# Patient Record
Sex: Male | Born: 1995 | Race: White | Hispanic: No | Marital: Single
Health system: Southern US, Community
[De-identification: ages and names within clinical notes are randomized; demographics above are authoritative.]

---
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
## 2017-03-04 ENCOUNTER — Telehealth: Payer: Self-pay | Admitting: Pediatrics

## 2017-03-04 NOTE — Telephone Encounter (Signed)
Patient is having severe allergies this year Was last seen by [facility] - per pt Can patient have a medication called in?? Does pt need to be seen for an injection??  Please call patients mother at [phone] to answer any questions  Pharmacy:: [facility] on [address]

## 2017-03-04 NOTE — Telephone Encounter (Signed)
Spoke to mother and informed her that he had to schedule a new patient appt. She understood and scheduled

## 2019-10-15 ENCOUNTER — Other Ambulatory Visit: Payer: Self-pay

## 2019-10-15 DIAGNOSIS — Z20822 Contact with and (suspected) exposure to covid-19: Secondary | ICD-10-CM

## 2019-10-16 LAB — NOVEL CORONAVIRUS, NAA: SARS-CoV-2, NAA: NOT DETECTED

## 2020-11-19 DIAGNOSIS — R1013 Epigastric pain: Secondary | ICD-10-CM | POA: Diagnosis not present

## 2020-11-19 DIAGNOSIS — R112 Nausea with vomiting, unspecified: Secondary | ICD-10-CM | POA: Diagnosis not present

## 2022-07-27 DIAGNOSIS — J3089 Other allergic rhinitis: Secondary | ICD-10-CM | POA: Insufficient documentation

## 2023-05-31 ENCOUNTER — Ambulatory Visit (INDEPENDENT_AMBULATORY_CARE_PROVIDER_SITE_OTHER): Payer: Medicare Other | Admitting: Podiatry

## 2023-05-31 ENCOUNTER — Encounter: Payer: Self-pay | Admitting: Podiatry

## 2023-05-31 VITALS — BP 130/70

## 2023-05-31 DIAGNOSIS — Q667 Congenital pes cavus, unspecified foot: Secondary | ICD-10-CM | POA: Diagnosis not present

## 2023-05-31 DIAGNOSIS — D2372 Other benign neoplasm of skin of left lower limb, including hip: Secondary | ICD-10-CM | POA: Diagnosis not present

## 2023-05-31 NOTE — Progress Notes (Signed)
  Subjective:  Patient ID: Joseph Meyer, male    DOB: [birth year],   MRN: [id number]  Chief Complaint  Patient presents with   Callouses    Left 5th toe which sometimes bother him when standing on foot all day    27 y.o. male presents for concern of left fifth toe pain and possible lesion. Here today with dad who relates he has autism and relates complains of pain on and off. He does work on his feet and the toe area can get painful  . Denies any other pedal complaints. Denies n/v/f/c.   History reviewed. No pertinent past medical history.  Objective:  Physical Exam: Vascular: DP/PT pulses 2/4 bilateral. CFT <3 seconds. Normal hair growth on digits. No edema.  Skin. No lacerations or abrasions bilateral feet. Hyperkeratotic cored lesion noted sub fifth metatarsal head and second metatarsal head as well as plantar fifth digit.  Musculoskeletal: MMT 5/5 bilateral lower extremities in DF, PF, Inversion and Eversion. Deceased ROM in DF of ankle joint. Cavus type foot bilatearl with plantar flexed first metatrsal.  Neurological: Sensation intact to light touch.   Assessment:   1. Benign neoplasm of skin of left foot   2. Pes cavus      Plan:  Patient was evaluated and treated and all questions answered. -Discussed  benign lesions of the foot  with patient and treatment options.  -Hyperkeratotic tissue was debrided with chisel without incident.  -Applied salycylic acid treatment to area with dressing. Advised to remove bandaging tomorrow.  -Encouraged daily moisturizing -Discussed use of pumice stone -Advised good supportive shoes and inserts -Discussed treatement options; discussed pes cavus  deformity;conservative and  surgical  -Discussed CMO.  -Recommend good supportive shoes -Patient to return to office as needed or sooner if condition worsens.   Louann Sjogren, DPM

## 2024-02-06 ENCOUNTER — Emergency Department (HOSPITAL_BASED_OUTPATIENT_CLINIC_OR_DEPARTMENT_OTHER): Payer: Worker's Compensation | Admitting: Radiology

## 2024-02-06 ENCOUNTER — Other Ambulatory Visit: Payer: Self-pay

## 2024-02-06 ENCOUNTER — Emergency Department (HOSPITAL_BASED_OUTPATIENT_CLINIC_OR_DEPARTMENT_OTHER)
Admission: EM | Admit: 2024-02-06 | Discharge: 2024-02-07 | Disposition: A | Discharge status: Home / Self Care | Payer: Worker's Compensation | Attending: Emergency Medicine | Admitting: Emergency Medicine

## 2024-02-06 ENCOUNTER — Encounter (HOSPITAL_BASED_OUTPATIENT_CLINIC_OR_DEPARTMENT_OTHER): Payer: Self-pay | Admitting: Emergency Medicine

## 2024-02-06 ENCOUNTER — Emergency Department (HOSPITAL_BASED_OUTPATIENT_CLINIC_OR_DEPARTMENT_OTHER)

## 2024-02-06 DIAGNOSIS — Y990 Civilian activity done for income or pay: Secondary | ICD-10-CM | POA: Insufficient documentation

## 2024-02-06 DIAGNOSIS — S79912A Unspecified injury of left hip, initial encounter: Secondary | ICD-10-CM | POA: Diagnosis present

## 2024-02-06 DIAGNOSIS — F840 Autistic disorder: Secondary | ICD-10-CM | POA: Diagnosis not present

## 2024-02-06 DIAGNOSIS — W010XXA Fall on same level from slipping, tripping and stumbling without subsequent striking against object, initial encounter: Secondary | ICD-10-CM | POA: Diagnosis not present

## 2024-02-06 DIAGNOSIS — S32425A Nondisplaced fracture of posterior wall of left acetabulum, initial encounter for closed fracture: Secondary | ICD-10-CM | POA: Insufficient documentation

## 2024-02-06 LAB — CBC WITH DIFFERENTIAL/PLATELET
Abs Immature Granulocytes: 0.1 10*3/uL — ABNORMAL HIGH (ref 0.00–0.07)
Basophils Absolute: 0.1 10*3/uL (ref 0.0–0.1)
Basophils Relative: 0 %
Eosinophils Absolute: 0 10*3/uL (ref 0.0–0.5)
Eosinophils Relative: 0 %
HCT: 43.5 % (ref 39.0–52.0)
Hemoglobin: 14.5 g/dL (ref 13.0–17.0)
Immature Granulocytes: 1 %
Lymphocytes Relative: 7 %
Lymphs Abs: 1 10*3/uL (ref 0.7–4.0)
MCH: 28.4 pg (ref 26.0–34.0)
MCHC: 33.3 g/dL (ref 30.0–36.0)
MCV: 85.1 fL (ref 80.0–100.0)
Monocytes Absolute: 0.8 10*3/uL (ref 0.1–1.0)
Monocytes Relative: 6 %
Neutro Abs: 12.8 10*3/uL — ABNORMAL HIGH (ref 1.7–7.7)
Neutrophils Relative %: 86 %
Platelets: 197 10*3/uL (ref 150–400)
RBC: 5.11 MIL/uL (ref 4.22–5.81)
RDW: 13.5 % (ref 11.5–15.5)
WBC: 14.8 10*3/uL — ABNORMAL HIGH (ref 4.0–10.5)
nRBC: 0 % (ref 0.0–0.2)

## 2024-02-06 LAB — BASIC METABOLIC PANEL
Anion gap: 8 (ref 5–15)
BUN: 17 mg/dL (ref 6–20)
CO2: 27 mmol/L (ref 22–32)
Calcium: 9 mg/dL (ref 8.9–10.3)
Chloride: 105 mmol/L (ref 98–111)
Creatinine, Ser: 1.23 mg/dL (ref 0.61–1.24)
GFR, Estimated: >60 mL/min (ref >60)
Glucose, Bld: 99 mg/dL (ref 70–99)
Potassium: 3.7 mmol/L (ref 3.5–5.1)
Sodium: 140 mmol/L (ref 135–145)

## 2024-02-06 MED ORDER — FENTANYL CITRATE PF 50 MCG/ML IJ SOSY
50.0000 ug | PREFILLED_SYRINGE | Freq: Once | INTRAMUSCULAR | Status: AC
  Administered 2024-02-06: 50 ug via INTRAVENOUS
  Filled 2024-02-06: qty 1

## 2024-02-06 NOTE — ED Provider Notes (Signed)
 [facility]ENC[facility] DEPARTMENT AT [facility] Provider Note   CSN: [id number] Arrival date & time: 02/06/24  2124     History {Add pertinent medical, surgical, social history, OB history to HPI:1} Chief Complaint  Patient presents with   Joseph Meyer is a 28 y.o. male.  Patient to ED after fall earlier today. He was mopping the floor and slipped, landing on his left hip. No other injury. History of autism.   The history is provided by the patient and a parent. No language interpreter was used.  Fall       Home Medications Prior to Admission medications   Medication Sig Start Date End Date Taking? Authorizing Provider  fluticasone (FLONASE) 50 MCG/ACT nasal spray Place 2 sprays into both nostrils daily. 03/15/23   [provider]  levocetirizine (XYZAL) 5 MG tablet Take by mouth. 03/15/23   [provider]  naproxen (NAPROSYN) 500 MG tablet TAKE 1 TABLET(500 MG) BY MOUTH TWICE DAILY WITH MEALS FOR 10 DAYS 03/16/23   [provider]  UNABLE TO FIND Take by mouth.    [provider]      Allergies    Blue dyes (parenteral) and Other    Review of Systems   Review of Systems  Physical Exam Updated Vital Signs BP 125/84   Pulse 80   Temp 98.2 F (36.8 C) (Oral)   Resp 18   Wt 124.7 kg   SpO2 97%  Physical Exam Vitals and nursing note reviewed.     ED Results / Procedures / Treatments   Labs (all labs ordered are listed, but only abnormal results are displayed) Labs Reviewed - No data to display  EKG None  Radiology DG Hip Unilat W or Wo Pelvis 2-3 Views Left Result Date: 02/06/2024 CLINICAL DATA:  Recent fall with left femoral pain, initial encounter EXAM: DG HIP (WITH OR WITHOUT PELVIS) 2V LEFT COMPARISON:  None Available. FINDINGS: There are fractures noted within the left acetabulum posteriorly. The left proximal femur is well seated. The remainder of the pelvic ring is intact. IMPRESSION: Posterior left  acetabular fracture without significant displacement. Electronically Signed   By: Alcide Clever M.D.   On: 02/06/2024 22:10    Procedures Procedures  {Document cardiac monitor, telemetry assessment procedure when appropriate:1}  Medications Ordered in ED Medications - No data to display  ED Course/ Medical Decision Making/ A&P   {   Click here for ABCD2, HEART and other calculatorsREFRESH Note before signing :1}                              Medical Decision Making This patient presents to the ED for concern of hip pain, this involves an extensive number of treatment options, and is a complaint that carries with it a high risk of complications and morbidity.  The differential diagnosis includes fracture, dislocation, soft tissue injury   Co morbidities that complicate the patient evaluation  Autism   Additional history obtained:  Additional history and/or information obtained from chart review, notable for n/a   Lab Tests:  I Ordered, and personally interpreted labs.  The pertinent results include:  ***    Imaging Studies ordered:  I ordered imaging studies including left hip plain film I independently visualized and interpreted imaging which showed acetabular fracture visualized. I agree with radiology interpretation   Cardiac Monitoring:  The patient was maintained on a cardiac monitor.  I personally viewed and interpreted the cardiac monitored which showed an underlying rhythm of: n/a   Medicines ordered and prescription drug management:  I ordered medication including ***  for *** Reevaluation of the patient after these medicines showed that the patient {resolved/improved/worsened:23923::"improved"} I have reviewed the patients home medicines and have made adjustments as needed   Test Considered:  CT hip for fracture definition   Critical Interventions:  N/a   Consultations Obtained:  I requested consultation with the orthopedics, Dr. Christell Constant,  and  discussed lab and imaging findings as well as pertinent plan - they recommend: ***   Problem List / ED Course:  Fall onto left hip - continues to have pain Hip xray showing acetabular fx - CT ordered on recommendation of orthopedics Christell Constant) Basic labs obtained, IV started for pain control   Reevaluation:  After the interventions noted above, I reevaluated the patient and found that they have :{resolved/improved/worsened:23923::"improved"}   Social Determinants of Health:  ***   Disposition:  After consideration of the diagnostic results and the patients response to treatment, I feel that the patient would benefit from ***.   Amount and/or Complexity of Data Reviewed Labs: ordered. Radiology: ordered.  Risk Prescription drug management.   ***  {Document critical care time when appropriate:1} {Document review of labs and clinical decision tools ie heart score, Chads2Vasc2 etc:1}  {Document your independent review of radiology images, and any outside records:1} {Document your discussion with family members, caretakers, and with consultants:1} {Document social determinants of health affecting pt's care:1} {Document your decision making why or why not admission, treatments were needed:1} Final Clinical Impression(s) / ED Diagnoses Final diagnoses:  None    Rx / DC Orders ED Discharge Orders     None

## 2024-02-06 NOTE — ED Triage Notes (Signed)
 Patient BIB [facility] c/o mechanical fall from standing position landing on his left hip.  Patient c/o left hip pain.

## 2024-02-07 MED ORDER — IBUPROFEN 400 MG PO TABS
600.0000 mg | ORAL_TABLET | Freq: Once | ORAL | Status: AC
  Administered 2024-02-07: 600 mg via ORAL
  Filled 2024-02-07: qty 1

## 2024-02-07 MED ORDER — METHOCARBAMOL 500 MG PO TABS: 500.0000 mg | ORAL_TABLET | Freq: Four times a day (QID) | ORAL | 0 refills | Status: DC | PRN

## 2024-02-07 MED ORDER — OXYCODONE-ACETAMINOPHEN 7.5-325 MG PO TABS: 1.0000 | ORAL_TABLET | ORAL | 0 refills | Status: AC | PRN

## 2024-02-07 MED ORDER — IBUPROFEN 600 MG PO TABS: 600.0000 mg | ORAL_TABLET | Freq: Four times a day (QID) | ORAL | 0 refills | Status: AC | PRN

## 2024-02-07 MED ORDER — OXYCODONE-ACETAMINOPHEN 5-325 MG PO TABS
1.0000 | ORAL_TABLET | Freq: Once | ORAL | Status: AC
  Administered 2024-02-07: 1 via ORAL
  Filled 2024-02-07: qty 1

## 2024-02-07 NOTE — Discharge Instructions (Signed)
 Use the crutches to be "touch down" only. This means you can rest your foot on the floor but not use the foot/leg to carry any of your weight.   Take Endocet and ibuprofen for pain as directed. Follow up with Dr. Christell Constant by calling the office in the morning and letting them know Dr. Christell Constant wants to see you in about 10 days for an acetabular fracture.

## 2024-02-22 ENCOUNTER — Ambulatory Visit (INDEPENDENT_AMBULATORY_CARE_PROVIDER_SITE_OTHER): Payer: Worker's Compensation | Admitting: Orthopedic Surgery

## 2024-02-22 ENCOUNTER — Other Ambulatory Visit (INDEPENDENT_AMBULATORY_CARE_PROVIDER_SITE_OTHER): Payer: Worker's Compensation

## 2024-02-22 VITALS — Ht 74.0 in | Wt 275.0 lb

## 2024-02-22 DIAGNOSIS — R102 Pelvic and perineal pain: Secondary | ICD-10-CM | POA: Diagnosis not present

## 2024-02-22 NOTE — Progress Notes (Signed)
 Orthopedic Surgery Progress Note   Assessment: Patient is a 28 y.o. male with nondisplaced left posterior wall acetabulum fracture -Date of injury: 02/06/2024 (~2 weeks from injury)   Plan: -Will continue with nonoperative management -DVT ppx: aspirin 81mg  BID - Would recommend touchdown weightbearing with patient has already wean himself to a cane and is weightbearing with some offloading on the right side.  Encouraged him to continue to offload the left side -Provided him with work restrictions.  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  He landed on his left side.  He presented to the [facility] department and was found to have a left acetabular fracture.  Orthopedics was called and recommended touchdown weightbearing.  He initially had a lot of pain but that has gotten significantly better with time.  He has weaned himself to using a cane and is weightbearing on the left side.  He does not have any pain besides the left hip.  Denies paresthesia numbness.   Physical Exam:  General: no acute distress, appears stated age Neurologic: alert, answering questions appropriately, following commands Respiratory: unlabored breathing on room air, symmetric chest rise Psychiatric: appropriate affect, normal cadence to speech  MSK:   -Left lower extremity  Pain with internal rotation but no pain through remainder of range of motion, no gross deformity, no open wounds Fires hip flexors, quadriceps, hamstrings, tibialis anterior, gastrocnemius and soleus, extensor hallucis longus Plantarflexes and dorsiflexes toes Sensation intact to light touch in  sural, saphenous, tibial, deep peroneal, and superficial peroneal nerve distributions Foot warm and well perfused  Imaging: XRs of the pelvis from 02/22/2024 was independently reviewed and interpreted, showing nondisplaced posterior wall of the acetabulum fracture.  It is in the distal aspect of the joint and not involving the weightbearing dome.  No other fracture seen.  No dislocation seen.  CT scan of the left hip from 02/06/2024 was independent reviewed and interpreted, showing a nondisplaced posterior wall acetabulum fracture.  No other fracture seen.  No dislocation seen.   Patient name: Joseph Meyer Patient MRN: [id number] Date: 02/22/24

## 2024-03-12 ENCOUNTER — Telehealth: Payer: Self-pay | Admitting: Orthopedic Surgery

## 2024-03-12 NOTE — Telephone Encounter (Signed)
 Pt's mother Moira Andrews called requesting a call back from Dr Sulema Endo or Kathaleen Pale. Moira Andrews states her son says pains are severe. She states son is special needs and to calm his anxiety down and let him know he is ok until appt date. Asking for a call from Dr Sulema Endo. Please call Moira Andrews at [phone].

## 2024-03-12 NOTE — Telephone Encounter (Signed)
 I called and spoke with patients mom, She states that they think he over did it on Saturday and since he has been having increased pain, she states that they have had him resting yesterday. I did advise that rest was the best, but that he could also try to take tylenol  1000 mg, 3 times a day for a few days to help calm it down. She did state that he is at work today from 9-12, doing light duty for the 3 hours. She will call me back if things change. She states that she also wanted to be able to let him know that she did speak with our office about this pain.

## 2024-03-21 ENCOUNTER — Other Ambulatory Visit (INDEPENDENT_AMBULATORY_CARE_PROVIDER_SITE_OTHER): Payer: Self-pay

## 2024-03-21 ENCOUNTER — Ambulatory Visit (INDEPENDENT_AMBULATORY_CARE_PROVIDER_SITE_OTHER): Payer: Worker's Compensation | Admitting: Orthopedic Surgery

## 2024-03-21 DIAGNOSIS — R102 Pelvic and perineal pain: Secondary | ICD-10-CM

## 2024-03-21 NOTE — Progress Notes (Signed)
 Orthopedic Surgery Progress Note     Assessment: Patient is a 28 y.o. male with nondisplaced left posterior wall acetabulum fracture -Date of injury: 02/06/2024 (~6 weeks from injury)     Plan: -Will continue with nonoperative management -DVT ppx: aspirin 81mg  BID -Still recommending TTWB. He continues to walk with a cane to try to offload the left leg -I told him it will take about 3 months for him to weight bear fully and get back to regular activities -Pain control: OTC medications -Follow up in office in 6 weeks, x-rays at next visit: AP and Judet views   ___________________________________________________________________________   Subjective: Patient has noticed pain getting better since he was last seen in the office. He is only taking tylenol  periodically. Has been weight bearing with a cane to try to offload the left leg. Denies paresthesias and numbness.      Physical Exam:   General: no acute distress, appears stated age Neurologic: alert, answering questions appropriately, following commands Respiratory: unlabored breathing on room air, symmetric chest rise Psychiatric: appropriate affect, normal cadence to speech   MSK:    -Left lower extremity             Pain with internal rotation past 10 degrees but no pain through remainder of range of motion, no gross deformity, no open wounds Fires hip flexors, quadriceps, hamstrings, tibialis anterior, gastrocnemius and soleus, extensor hallucis longus Plantarflexes and dorsiflexes toes Sensation intact to light touch in sural, saphenous, tibial, deep peroneal, and superficial peroneal nerve distributions Foot warm and well perfused   Imaging: XRs of the pelvis from 03/21/2024 were independently reviewed and interpreted, showing nondisplaced posterior wall of the acetabulum fracture that does not involve the weight bearing dome portion.  No other fracture seen.  No dislocation seen. No interval change since last films on  02/22/2024.     Patient name: Joseph Meyer Patient MRN: [id number] Date: 03/21/24

## 2024-03-22 ENCOUNTER — Encounter: Payer: Self-pay | Admitting: Radiology

## 2024-03-27 ENCOUNTER — Telehealth: Payer: Self-pay | Admitting: Orthopedic Surgery

## 2024-03-27 NOTE — Telephone Encounter (Signed)
 Kesiha scheduled him for Friday @ 9

## 2024-03-27 NOTE — Telephone Encounter (Signed)
 Patient mom called and said that he fell and hurt himself. He has a  left fracture hip bone but fell on his left knee and just making sure that the hip isn't hurt more than what it  is. CB#[phone]

## 2024-03-30 ENCOUNTER — Other Ambulatory Visit (INDEPENDENT_AMBULATORY_CARE_PROVIDER_SITE_OTHER): Payer: Worker's Compensation

## 2024-03-30 ENCOUNTER — Ambulatory Visit (INDEPENDENT_AMBULATORY_CARE_PROVIDER_SITE_OTHER): Payer: Worker's Compensation | Admitting: Orthopedic Surgery

## 2024-03-30 DIAGNOSIS — R102 Pelvic and perineal pain: Secondary | ICD-10-CM | POA: Diagnosis not present

## 2024-03-30 MED ORDER — METHOCARBAMOL 500 MG PO TABS: 500.0000 mg | ORAL_TABLET | Freq: Four times a day (QID) | ORAL | 0 refills | Status: AC | PRN

## 2024-03-30 NOTE — Progress Notes (Signed)
 Orthopedic Surgery Progress Note     Assessment: Patient is a 28 y.o. male with nondisplaced left posterior wall acetabulum fracture -Date of injury: 02/06/2024 (~7 weeks from injury)     Plan: - There has been some interval displacement noted on particularly AP view.  I went over with this with him and his mom.  Since this fracture does not involve the weightbearing portion of the acetabulum and is inferior to the joint, I recommended continued nonoperative treatment -DVT ppx: aspirin 81mg  BID - Touchdown weightbearing left lower extremity -Prescribed a Robaxin  for him today.  Can use Tylenol  as well -Follow up in office in 5 weeks, x-rays at next visit: AP and Judet views   ___________________________________________________________________________   Subjective: After our last office visit, patient tripped over his cane and fell.  He said he landed on his left knee.  He noted increased pain in his hip at the time.  He was still able to ambulate.  Pain has gotten better and is now back to how it was at our last office visit.  He said he had some bruising around the knee but that has resolved.  He continues to ambulate with a cane.  Denies paresthesias and numbness.     Physical Exam:   General: no acute distress, appears stated age, ambulating with cane Neurologic: alert, answering questions appropriately, following commands Respiratory: unlabored breathing on room air, symmetric chest rise Psychiatric: appropriate affect, normal cadence to speech   MSK:    -Left lower extremity             Pain with internal rotation past 10 degrees but no pain through remainder of range of motion, negative FABER, no gross deformity EHL/TA/GSC intact Plantarflexes and dorsiflexes toes Sensation intact to light touch in sural, saphenous, tibial, deep peroneal, and superficial peroneal nerve distributions Foot warm and well perfused   Imaging: XRs of the pelvis from 03/30/2024 were independently  reviewed and interpreted, showing minimally displaced posterior wall of the acetabular fracture.  The fracture does not involve the weightbearing portion of the dome.  There does appear to be interval displacement of the fracture since his last films on 03/21/2024.  This is noted particularly on the AP view.  No new fracture seen.    Patient name: Joseph Meyer Patient MRN: [id number] Date: 03/30/24

## 2024-05-02 ENCOUNTER — Other Ambulatory Visit (INDEPENDENT_AMBULATORY_CARE_PROVIDER_SITE_OTHER): Payer: Worker's Compensation

## 2024-05-02 ENCOUNTER — Ambulatory Visit (INDEPENDENT_AMBULATORY_CARE_PROVIDER_SITE_OTHER): Payer: Worker's Compensation | Admitting: Orthopedic Surgery

## 2024-05-02 DIAGNOSIS — R102 Pelvic and perineal pain: Secondary | ICD-10-CM | POA: Diagnosis not present

## 2024-05-02 NOTE — Progress Notes (Signed)
 Orthopedic Surgery Progress Note   Assessment: Patient is a 28 y.o. male with nondisplaced left posterior wall acetabulum fracture -Date of injury: 02/06/2024 (~3 months from injury)     Plan: -Fracture remains minimally displaced, he is having no pain in the hip, he has no pain on exam so he can gradually return to full activities as tolerated.  He should refrain from running or squatting until our next visit, but he can do other activities as tolerated -Can discontinue can use -DVT ppx: None -Weightbearing as tolerated bilateral lower extremities -Can use Tylenol  as needed for pain relief -Patient can return to work without any restriction on 05/07/2024 -Follow up in office in 6 weeks, x-rays at next visit: none   ___________________________________________________________________________   Subjective: Patient has been doing well since he was last seen in the office.  He is not having any pain in his hip.  He continues to ambulate with a cane to try to offload bedside.  He said he went to the gym earlier today to work on quad strengthening on both the right and left legs.  He had no pain when he was at the gym.  He is not having any pain with ambulation.  He is not taking any pain medications.     Physical Exam:   General: no acute distress, appears stated age, ambulating with cane Neurologic: alert, answering questions appropriately, following commands Respiratory: unlabored breathing on room air, symmetric chest rise Psychiatric: appropriate affect, normal cadence to speech   MSK:    -Left lower extremity             No pain through range of motion at the hip, symmetric range of motion passively when compared to the contralateral side EHL/TA/GSC intact Plantarflexes and dorsiflexes toes Sensation intact to light touch in sural, saphenous, tibial, deep peroneal, and superficial peroneal nerve distributions Foot warm and well perfused   Imaging: XRs of the pelvis from 05/02/2024  were independently reviewed and interpreted, showing minimally displaced posterior wall of the acetabular fracture.  No interval displacement seen since last films on 03/30/2024.  No significant callus formation seen.  No new fracture seen.  No dislocation seen.     Patient name: Joseph Meyer Patient MRN: [id number] Date: 05/02/24

## 2024-05-08 ENCOUNTER — Encounter: Payer: Self-pay | Admitting: Radiology

## 2024-05-08 ENCOUNTER — Telehealth: Payer: Self-pay | Admitting: Orthopedic Surgery

## 2024-05-08 NOTE — Telephone Encounter (Signed)
 Joseph Meyer patient's mother states the return to work note from 05/02/24 from Dr Georgina needs to be revised to say (without restrictions). Joseph Meyer needs this ASAP for his employer. Can the corrected letter be emailed to the mother Joseph Meyer @ [email or web address]

## 2024-05-08 NOTE — Telephone Encounter (Signed)
 Note written and emailed to the email on message

## 2024-06-13 ENCOUNTER — Ambulatory Visit (INDEPENDENT_AMBULATORY_CARE_PROVIDER_SITE_OTHER): Payer: Worker's Compensation | Admitting: Orthopedic Surgery

## 2024-06-13 DIAGNOSIS — S32422A Displaced fracture of posterior wall of left acetabulum, initial encounter for closed fracture: Secondary | ICD-10-CM

## 2024-06-13 NOTE — Progress Notes (Signed)
 Orthopedic Surgery Progress Note   Assessment: Patient is a 28 y.o. male with nondisplaced left posterior wall acetabulum fracture -Date of injury: 02/06/2024 (~4 months from injury)     Plan: -No operative plans at this time -Can return to full activity with no restrictions -DVT ppx: None -Weightbearing as tolerated bilateral lower extremities - Return to office on an as needed basis   ___________________________________________________________________________   Subjective: Patient has returned to work and has been going to the gym since he was last seen in the office.  He has been jogging and has not been having any hip pain.  He said he has not noticed any hip pain since he was last seen in the office.  He is not taking any medications for pain.  He has not noticed anything he is limiting himself in because of his hip.  He is pleased with how he is doing at this point.     Physical Exam:   General: no acute distress, appears stated age, ambulating with cane Neurologic: alert, answering questions appropriately, following commands Respiratory: unlabored breathing on room air, symmetric chest rise Psychiatric: appropriate affect, normal cadence to speech   MSK:    -Left lower extremity  Ambulating without assistive device, nonantalgic gait             No pain through full range of motion at the hip, symmetric range of motion passively when compared to the contralateral side EHL/TA/GSC intact Plantarflexes and dorsiflexes toes Sensation intact to light touch in sural, saphenous, tibial, deep peroneal, and superficial peroneal nerve distributions Foot warm and well perfused   Imaging: XRs of the pelvis from 05/02/2024 were previously independently reviewed and interpreted, showing minimally displaced posterior wall of the acetabular fracture.  No interval displacement seen since last films on 03/30/2024.  No significant callus formation seen.  No new fracture seen.  No dislocation  seen.     Patient name: Joseph Meyer Patient MRN: [id number] Date: 06/13/24
